# Patient Record
Sex: Female | Born: 1995 | Race: White | Hispanic: No | Marital: Single | Smoking: Never smoker
Health system: Southern US, Community
[De-identification: ages and names within clinical notes are randomized; demographics above are authoritative.]

## PROBLEM LIST (undated history)

## (undated) HISTORY — PX: ADENOIDECTOMY: SUR15

## (undated) HISTORY — PX: TONSILLECTOMY: SUR1361

---
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
## 2015-01-15 ENCOUNTER — Encounter: Payer: Self-pay | Admitting: Physician Assistant

## 2015-01-15 ENCOUNTER — Ambulatory Visit (INDEPENDENT_AMBULATORY_CARE_PROVIDER_SITE_OTHER): Payer: BLUE CROSS/BLUE SHIELD | Admitting: Physician Assistant

## 2015-01-15 ENCOUNTER — Ambulatory Visit: Payer: BLUE CROSS/BLUE SHIELD

## 2015-01-15 VITALS — BP 111/58 | HR 91 | Ht 68.01 in | Wt 146.0 lb

## 2015-01-15 DIAGNOSIS — F909 Attention-deficit hyperactivity disorder, unspecified type: Secondary | ICD-10-CM

## 2015-01-15 DIAGNOSIS — R41840 Attention and concentration deficit: Secondary | ICD-10-CM

## 2015-01-15 DIAGNOSIS — R208 Other disturbances of skin sensation: Secondary | ICD-10-CM | POA: Diagnosis not present

## 2015-01-15 DIAGNOSIS — Z832 Family history of diseases of the blood and blood-forming organs and certain disorders involving the immune mechanism: Secondary | ICD-10-CM

## 2015-01-15 DIAGNOSIS — I8391 Asymptomatic varicose veins of right lower extremity: Secondary | ICD-10-CM | POA: Diagnosis not present

## 2015-01-15 DIAGNOSIS — R200 Anesthesia of skin: Secondary | ICD-10-CM

## 2015-01-15 DIAGNOSIS — F988 Other specified behavioral and emotional disorders with onset usually occurring in childhood and adolescence: Secondary | ICD-10-CM

## 2015-01-15 LAB — COMPLETE METABOLIC PANEL WITH GFR
ALBUMIN: 3.9 g/dL (ref 3.6–5.1)
ALT: 12 U/L (ref 5–32)
AST: 15 U/L (ref 12–32)
Alkaline Phosphatase: 40 U/L — ABNORMAL LOW (ref 47–176)
BUN: 12 mg/dL (ref 7–20)
CHLORIDE: 103 mmol/L (ref 98–110)
CO2: 24 mmol/L (ref 20–31)
Calcium: 9 mg/dL (ref 8.9–10.4)
Creat: 0.72 mg/dL (ref 0.50–1.00)
GFR, Est African American: >89 mL/min (ref >=60)
GFR, Est Non African American: >89 mL/min (ref >=60)
GLUCOSE: 81 mg/dL (ref 65–99)
POTASSIUM: 4.1 mmol/L (ref 3.8–5.1)
SODIUM: 136 mmol/L (ref 135–146)
Total Bilirubin: 0.5 mg/dL (ref 0.2–1.1)
Total Protein: 6.7 g/dL (ref 6.3–8.2)

## 2015-01-15 LAB — CBC WITH DIFFERENTIAL/PLATELET
Basophils Absolute: 0 10*3/uL (ref 0.0–0.1)
Basophils Relative: 0 % (ref 0–1)
EOS ABS: 0.1 10*3/uL (ref 0.0–0.7)
Eosinophils Relative: 2 % (ref 0–5)
HCT: 38 % (ref 36.0–46.0)
HEMOGLOBIN: 12.7 g/dL (ref 12.0–15.0)
Lymphocytes Relative: 41 % (ref 12–46)
Lymphs Abs: 2.8 10*3/uL (ref 0.7–4.0)
MCH: 30.2 pg (ref 26.0–34.0)
MCHC: 33.4 g/dL (ref 30.0–36.0)
MCV: 90.5 fL (ref 78.0–100.0)
MONO ABS: 0.5 10*3/uL (ref 0.1–1.0)
MPV: 10 fL (ref 8.6–12.4)
Monocytes Relative: 7 % (ref 3–12)
NEUTROS PCT: 50 % (ref 43–77)
Neutro Abs: 3.5 10*3/uL (ref 1.7–7.7)
PLATELETS: 245 10*3/uL (ref 150–400)
RBC: 4.2 MIL/uL (ref 3.87–5.11)
RDW: 13.1 % (ref 11.5–15.5)
WBC: 6.9 10*3/uL (ref 4.0–10.5)

## 2015-01-15 MED ORDER — LISDEXAMFETAMINE DIMESYLATE 30 MG PO CAPS: 30.0000 mg | ORAL_CAPSULE | Freq: Every day | ORAL | Status: DC

## 2015-01-15 NOTE — Patient Instructions (Signed)
Could consider HPV and needs last dose of varicella.

## 2015-01-15 NOTE — Progress Notes (Deleted)
   Subjective:    Patient ID: Pamela Newton, female    DOB: April 30, 1995, 19 y.o.   MRN: [id number]  HPI    Review of Systems     Objective:   Physical Exam        Assessment & Plan:

## 2015-01-15 NOTE — Progress Notes (Addendum)
Pamela Newton is a 19 y.o. female who presents to [facility]  today for numbness on right inner thigh. Pamela Newton states that she began noticing tingling around the area of a previously known varicose vein on Saturday night, shortly it after it went numb and has remained numb. She originally went to an urgent care in [place] where she was told to go to the ED, which she did not. Patient's mother and sister are both positive for factor V leiden  Deficiency. She states that this has never happened before. She denies any trauma to the area, alleviating or aggravating factors, swelling, redness, warmth, loss of bowel or bladder function, fever, nausea, vomiting, diarrhea or constipation. Denies any lumbar back pain.   Patient has requested that we update her lab work and re-check her for factor V leiden deficiency because she was last checked in [place].   Patient requested refill of birth control today as well.   Pt need vyvanse refill. Denies any side effects. Doing well at school. Grades are wonderful.    No past medical history on file. Past Surgical History  Procedure Laterality Date  . Adenoidectomy    . Tonsillectomy     Social History  Substance Use Topics  . Smoking status: Never Smoker   . Smokeless tobacco: Never Used  . Alcohol Use: No   family history is not on file.  ROS as above Medications: Current Outpatient Prescriptions  Medication Sig Dispense Refill  . lisdexamfetamine (VYVANSE) 30 MG capsule Take 1 capsule (30 mg total) by mouth daily. 30 capsule 0  . Norethindrone Acetate-Ethinyl Estradiol (JUNEL,LOESTRIN,MICROGESTIN) 1.5-30 MG-MCG tablet Take 1 tablet by mouth daily. 3 Package 4   No current facility-administered medications for this visit.   Allergies  Allergen Reactions  . Concerta [Methylphenidate] Other (See Comments)    Mood swing and irritable  . Shellfish Allergy   . Amoxicillin Rash     Exam:  BP 111/58  mmHg  Pulse 91  Ht 5' 8.01" (1.727 m)  Wt 146 lb (66.225 kg)  BMI 22.20 kg/m2 Gen: WDWN female in no acute distress.  HEENT: EOMI,  MMM Lungs: Normal work of breathing. CTABL Heart: RRR no MRG Exts: Brisk capillary refill, warm and well perfused. Approximately 2 cm in length and 0.5 cm in width varicose vein on right upper thigh approximately mid-way between knee and hip. Patient describes area of numbness beginning at varicose vein and extending to approximately femoral head in length and approximately 3 cm in width. Patient states numbness does not extend to posterior, anterior or lateral thigh. Area is palpable and fully reducible w/o redness, warmth, pain or fluctuation.   Assessment: 1. Right lower extremity varicose vein and numbness based on patient h/o previus varicose veins, patient description of symptoms and physical exam findings.  2. ADD   Plan: 1. Venous doppler ordered to assess right thigh area for thrombosis or inflammation and to evaluate possible causes of numbness.  Addendum:  DVT was negative for DVT or SVT. There was some focal ectasia around area of concern.  Discussed light massage for patient and warm compresses. May need to concern vein specialist if varicose veins become a problem.   CMP, CBC and factor V leiden blood tests ordered today to assess factor V leiden status as well as fulfill patient request for up to date labwork.  Will hold OCP refill until u/s results are back and factor V leiden status is determined.  Patient instructed  to return to office or ED if symptoms worsen, patient experiences chest pain, syncope or shortness of breath. Patient conveyed understanding and agreed to current treatment plan.  2. Refilled for 90 days Vyvanse.

## 2015-01-18 LAB — FACTOR 5 LEIDEN

## 2015-01-19 ENCOUNTER — Other Ambulatory Visit: Payer: Self-pay | Admitting: Physician Assistant

## 2015-01-19 DIAGNOSIS — R200 Anesthesia of skin: Secondary | ICD-10-CM

## 2015-01-19 DIAGNOSIS — R202 Paresthesia of skin: Secondary | ICD-10-CM

## 2015-01-19 DIAGNOSIS — I8390 Asymptomatic varicose veins of unspecified lower extremity: Secondary | ICD-10-CM | POA: Insufficient documentation

## 2015-01-24 ENCOUNTER — Telehealth: Payer: Self-pay | Admitting: *Deleted

## 2015-01-24 NOTE — Telephone Encounter (Signed)
Does she have any other symptoms? Fever and cough?sinus pressure? Ear pain? ST?abdominal pain.   Is her leg warm? Red? Swollen? She certainly needs to see vein specialist veins can get infected and inflamed. When I saw her neither were the case. Can we make sure she gets appt ASAP. Will route to cindy as well. Referral in system.   If think fever is coming from leg [facility]

## 2015-01-24 NOTE — Telephone Encounter (Signed)
Pt's mom left vm stating that the pt has been running a fever and her leg has been swelling.  She stated that we didn't place the referral but it was done, she just hasn't been contacted yet.  Please advise on what pt should do.

## 2015-01-24 NOTE — Telephone Encounter (Signed)
LMOM advising pt to go to [facility] if she thinks the fever is coming from her leg.

## 2015-01-25 ENCOUNTER — Telehealth: Payer: Self-pay

## 2015-01-25 NOTE — Telephone Encounter (Signed)
Patient called and reports increase in swelling of her right leg. I was unable to get her a sooner appointment with [facility]. She is wanting to see someone ASAP. Please advise.

## 2015-01-26 NOTE — Telephone Encounter (Signed)
She can come here or go to urgent care.

## 2015-01-26 NOTE — Telephone Encounter (Signed)
Left message advising patient to call back to schedule a follow up.

## 2015-02-21 ENCOUNTER — Encounter: Payer: Self-pay | Admitting: *Deleted

## 2015-04-18 ENCOUNTER — Other Ambulatory Visit: Payer: Self-pay | Admitting: *Deleted

## 2015-04-18 DIAGNOSIS — Z3009 Encounter for other general counseling and advice on contraception: Secondary | ICD-10-CM

## 2015-04-18 MED ORDER — NORETHINDRONE ACET-ETHINYL EST 1.5-30 MG-MCG PO TABS: 1.0000 | ORAL_TABLET | Freq: Every day | ORAL | Status: DC

## 2015-10-15 ENCOUNTER — Ambulatory Visit (INDEPENDENT_AMBULATORY_CARE_PROVIDER_SITE_OTHER): Payer: BLUE CROSS/BLUE SHIELD | Admitting: Physician Assistant

## 2015-10-15 ENCOUNTER — Encounter: Payer: Self-pay | Admitting: Physician Assistant

## 2015-10-15 DIAGNOSIS — F988 Other specified behavioral and emotional disorders with onset usually occurring in childhood and adolescence: Secondary | ICD-10-CM

## 2015-10-15 DIAGNOSIS — F909 Attention-deficit hyperactivity disorder, unspecified type: Secondary | ICD-10-CM | POA: Diagnosis not present

## 2015-10-15 MED ORDER — LISDEXAMFETAMINE DIMESYLATE 30 MG PO CAPS: 30.0000 mg | ORAL_CAPSULE | Freq: Every day | ORAL | 0 refills | Status: DC

## 2015-10-15 NOTE — Progress Notes (Signed)
   Subjective:    Patient ID: Pamela Newton, female    DOB: 1995/05/26, 20 y.o.   MRN: [id number]  HPI Patient is a 20 year old female who presents to the clinic to restart her ADHD medication. She has been off this for a while. She does wish to restart it. 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 She feels like she will need this to help her study and focus. She does not take on weekends and during the summer. She denies any previous side effects.   Review of Systems  All other systems reviewed and are negative.      Objective:   Physical Exam  Constitutional: She is oriented to person, place, and time. She appears well-developed and well-nourished.  HENT:  Head: Normocephalic and atraumatic.  Cardiovascular: Normal rate, regular rhythm and normal heart sounds.   Pulmonary/Chest: Effort normal and breath sounds normal.  Neurological: She is alert and oriented to person, place, and time.  Psychiatric: She has a normal mood and affect. Her behavior is normal.          Assessment & Plan:  ADD- refilled vyvanse for 3 months. Vitals look great.

## 2016-01-13 ENCOUNTER — Other Ambulatory Visit: Payer: Self-pay | Admitting: Physician Assistant

## 2016-03-25 ENCOUNTER — Other Ambulatory Visit: Payer: Self-pay | Admitting: Physician Assistant

## 2016-03-25 ENCOUNTER — Other Ambulatory Visit: Payer: Self-pay | Admitting: *Deleted

## 2016-03-25 DIAGNOSIS — Z3009 Encounter for other general counseling and advice on contraception: Secondary | ICD-10-CM

## 2016-03-25 MED ORDER — NORETHINDRONE ACET-ETHINYL EST 1.5-30 MG-MCG PO TABS: 1.0000 | ORAL_TABLET | Freq: Every day | ORAL | 4 refills | Status: DC

## 2016-03-25 NOTE — Telephone Encounter (Signed)
Patient called to double check on birth control med refill.... Patient adv [facility] has sent a request and I adv will prob be refilled in the am on 03/26/16 but will send a message. Thanks

## 2016-03-25 NOTE — Telephone Encounter (Signed)
I signed rx today.

## 2016-12-19 ENCOUNTER — Encounter: Payer: Self-pay | Admitting: Physician Assistant

## 2016-12-19 ENCOUNTER — Ambulatory Visit (INDEPENDENT_AMBULATORY_CARE_PROVIDER_SITE_OTHER): Payer: Managed Care, Other (non HMO) | Admitting: Physician Assistant

## 2016-12-19 VITALS — BP 103/60 | HR 87 | Ht 68.0 in | Wt 135.4 lb

## 2016-12-19 DIAGNOSIS — R000 Tachycardia, unspecified: Secondary | ICD-10-CM | POA: Diagnosis not present

## 2016-12-19 DIAGNOSIS — F988 Other specified behavioral and emotional disorders with onset usually occurring in childhood and adolescence: Secondary | ICD-10-CM | POA: Diagnosis not present

## 2016-12-19 DIAGNOSIS — R41840 Attention and concentration deficit: Secondary | ICD-10-CM | POA: Diagnosis not present

## 2016-12-19 DIAGNOSIS — Z3009 Encounter for other general counseling and advice on contraception: Secondary | ICD-10-CM | POA: Diagnosis not present

## 2016-12-19 MED ORDER — AMPHETAMINE-DEXTROAMPHETAMINE 10 MG PO TABS: 10.0000 mg | ORAL_TABLET | Freq: Two times a day (BID) | ORAL | 0 refills | Status: DC

## 2016-12-19 NOTE — Progress Notes (Signed)
   Subjective:    Patient ID: Pamela Newton, female    DOB: Nov 01, 1995, 21 y.o.   MRN: [id number]  HPI  Pt is a 21 yo pleasant female who presents to the clinic for follow up on vyvanse for ADD. She feels like vyvanse works but way too expensive and cannot afford to take it every day. In the past she has tried Theatre manager but made her moody. She also has taken a few adderall XR and felt like she was moody as well. She would like to try immediate release today. She is in college.  Pt would like to consider other OCP options. She is interested in IUD but wants to know more.   Pt also mentions her heart rate at times will get alert from iwatch. When she exercises get up to 170-200. Before test it can get to around 130. She denies any palpitations or chest pains. She wants to know if this is normal. She denies her resting HR above 100.   .. Active Ambulatory Problems    Diagnosis Date Noted  . Dysmenorrhea 08/26/2011  . Outbursts of anger 05/28/2012  . Anxiety state, unspecified 05/28/2012  . Fatigue 04/15/2013  . Anhedonia 04/15/2013  . Inattention 02/08/2014  . Varicose veins 01/19/2015  . Birth control counseling 12/22/2016  . Tachycardia 12/22/2016  . 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 ABDOMINAL CRAMPS 02/13/2006   No Additional Past Medical History      Review of Systems  All other systems reviewed and are negative.      Objective:   Physical Exam  Constitutional: She is oriented to person, place, and time. She appears well-developed and well-nourished.  HENT:  Head: Normocephalic and atraumatic.  Neck: Normal range of motion. Neck supple. No thyromegaly present.  Cardiovascular: Normal rate, regular rhythm and normal heart sounds.   Pulmonary/Chest: Effort normal and breath sounds normal.  Neurological: She is alert and oriented to person, place, and time.  Psychiatric: She  has a normal mood and affect. Her behavior is normal.          Assessment & Plan:  Marland KitchenMarland KitchenSantasia was seen today for medication management.  Diagnoses and all orders for this visit:  Attention deficit disorder (ADD) without hyperactivity -     amphetamine-dextroamphetamine (ADDERALL) 10 MG tablet; Take 1 tablet (10 mg total) by mouth 2 (two) times daily.  Tachycardia -     TSH -     CBC with Differential/Platelet -     Ferritin -     COMPLETE METABOLIC PANEL WITH GFR  Inattention  Birth control counseling   Stop vyvanse will try immediate release adderall. Discussed side effects. Follow up in 1 month.   Tachycardia with exercise and with stress both seem like normal times for elevation. Discussed with pt the 220 minus your age rule with exercise. She is still within normal limits. Continue to monitor and keep a diary of when it happesn. Labs ordered to evaluate any metabolic cause.   Discussed OCP options. She would like to see GYN for IUD. Offered our services here but she declined and wanted to go to gyn.   Marland Kitchen.Spent 30 minutes with patient and greater than 50 percent of visit spent counseling patient regarding treatment plan.

## 2016-12-20 LAB — TSH: TSH: 1.88 m[IU]/L

## 2016-12-20 LAB — COMPLETE METABOLIC PANEL WITH GFR
AG RATIO: 1.6 (calc) (ref 1.0–2.5)
ALT: 18 U/L (ref 6–29)
AST: 20 U/L (ref 10–30)
Albumin: 4.4 g/dL (ref 3.6–5.1)
Alkaline phosphatase (APISO): 44 U/L (ref 33–115)
BILIRUBIN TOTAL: 0.6 mg/dL (ref 0.2–1.2)
BUN: 10 mg/dL (ref 7–25)
CALCIUM: 9.2 mg/dL (ref 8.6–10.2)
CHLORIDE: 105 mmol/L (ref 98–110)
CO2: 24 mmol/L (ref 20–32)
Creat: 0.81 mg/dL (ref 0.50–1.10)
GFR, EST AFRICAN AMERICAN: 121 mL/min/{1.73_m2} (ref >=60)
GFR, Est Non African American: 105 mL/min/{1.73_m2} (ref >=60)
GLOBULIN: 2.7 g/dL (ref 1.9–3.7)
Glucose, Bld: 67 mg/dL (ref 65–99)
POTASSIUM: 4.5 mmol/L (ref 3.5–5.3)
SODIUM: 139 mmol/L (ref 135–146)
Total Protein: 7.1 g/dL (ref 6.1–8.1)

## 2016-12-20 LAB — CBC WITH DIFFERENTIAL/PLATELET
Basophils Absolute: 22 cells/uL (ref 0–200)
Basophils Relative: 0.4 %
EOS PCT: 1.6 %
Eosinophils Absolute: 88 cells/uL (ref 15–500)
HEMATOCRIT: 38.1 % (ref 35.0–45.0)
Hemoglobin: 13.1 g/dL (ref 11.7–15.5)
Lymphs Abs: 3091 cells/uL (ref 850–3900)
MCH: 30.8 pg (ref 27.0–33.0)
MCHC: 34.4 g/dL (ref 32.0–36.0)
MCV: 89.6 fL (ref 80.0–100.0)
MONOS PCT: 7.2 %
MPV: 10.3 fL (ref 7.5–12.5)
NEUTROS PCT: 34.6 %
Neutro Abs: 1903 cells/uL (ref 1500–7800)
PLATELETS: 225 10*3/uL (ref 140–400)
RBC: 4.25 10*6/uL (ref 3.80–5.10)
RDW: 11.9 % (ref 11.0–15.0)
Total Lymphocyte: 56.2 %
WBC mixed population: 396 cells/uL (ref 200–950)
WBC: 5.5 10*3/uL (ref 3.8–10.8)

## 2016-12-20 LAB — FERRITIN: FERRITIN: 63 ng/mL (ref 10–154)

## 2016-12-22 ENCOUNTER — Encounter: Payer: Self-pay | Admitting: Physician Assistant

## 2016-12-22 DIAGNOSIS — R000 Tachycardia, unspecified: Secondary | ICD-10-CM | POA: Insufficient documentation

## 2016-12-22 DIAGNOSIS — F988 Other specified behavioral and emotional disorders with onset usually occurring in childhood and adolescence: Secondary | ICD-10-CM | POA: Insufficient documentation

## 2016-12-22 DIAGNOSIS — Z3009 Encounter for other general counseling and advice on contraception: Secondary | ICD-10-CM | POA: Insufficient documentation

## 2017-01-05 ENCOUNTER — Ambulatory Visit: Payer: Managed Care, Other (non HMO) | Admitting: Obstetrics and Gynecology

## 2017-01-19 ENCOUNTER — Ambulatory Visit: Payer: Managed Care, Other (non HMO) | Admitting: Physician Assistant

## 2017-01-26 ENCOUNTER — Ambulatory Visit (INDEPENDENT_AMBULATORY_CARE_PROVIDER_SITE_OTHER): Payer: Managed Care, Other (non HMO) | Admitting: Physician Assistant

## 2017-01-26 ENCOUNTER — Encounter: Payer: Self-pay | Admitting: Physician Assistant

## 2017-01-26 VITALS — BP 120/68 | HR 86 | Ht 68.0 in | Wt 134.0 lb

## 2017-01-26 DIAGNOSIS — F988 Other specified behavioral and emotional disorders with onset usually occurring in childhood and adolescence: Secondary | ICD-10-CM

## 2017-01-26 DIAGNOSIS — S93409A Sprain of unspecified ligament of unspecified ankle, initial encounter: Secondary | ICD-10-CM

## 2017-01-26 MED ORDER — AMPHETAMINE-DEXTROAMPHETAMINE 10 MG PO TABS: 10.0000 mg | ORAL_TABLET | Freq: Two times a day (BID) | ORAL | 0 refills | Status: DC

## 2017-01-26 NOTE — Patient Instructions (Signed)
Ankle Sprain, Phase I Rehab Ask your health care provider which exercises are safe for you. Do exercises exactly as told by your health care provider and adjust them as directed. It is normal to feel mild stretching, pulling, tightness, or discomfort as you do these exercises, but you should stop right away if you feel sudden pain or your pain gets worse.Do not begin these exercises until told by your health care provider. Stretching and range of motion exercises These exercises warm up your muscles and joints and improve the movement and flexibility of your lower leg and ankle. These exercises also help to relieve pain and stiffness. Exercise A: Gastroc and soleus stretch  1. Sit on the floor with your left / right leg extended. 2. Loop a belt or towel around the ball of your left / right foot. The ball of your foot is on the walking surface, right under your toes. 3. Keep your left / right ankle and foot relaxed and keep your knee straight while you use the belt or towel to pull your foot toward you. You should feel a gentle stretch behind your calf or knee. 4. Hold this position for __________ seconds, then release to the starting position. Repeat the exercise with your knee bent. You can put a pillow or a rolled bath towel under your knee to support it. You should feel a stretch deep in your calf or at your Achilles tendon. Repeat each stretch __________ times. Complete these stretches __________ times a day. Exercise B: Ankle alphabet  1. Sit with your left / right leg supported at the lower leg. ? Do not rest your foot on anything. ? Make sure your foot has room to move freely. 2. Think of your left / right foot as a paintbrush, and move your foot to trace each letter of the alphabet in the air. Keep your hip and knee still while you trace. Make the letters as large as you can without feeling discomfort. 3. Trace every letter from A to Z. Repeat __________ times. Complete this exercise  __________ times a day. Strengthening exercises These exercises build strength and endurance in your ankle and lower leg. Endurance is the ability to use your muscles for a long time, even after they get tired. Exercise C: Dorsiflexors  1. Secure a rubber exercise band or tube to an object, such as a table leg, that will stay still when the band is pulled. Secure the other end around your left / right foot. 2. Sit on the floor facing the object, with your left / right leg extended. The band or tube should be slightly tense when your foot is relaxed. 3. Slowly bring your foot toward you, pulling the band tighter. 4. Hold this position for __________ seconds. 5. Slowly return your foot to the starting position. Repeat __________ times. Complete this exercise __________ times a day. Exercise D: Plantar flexors  1. Sit on the floor with your left / right leg extended. 2. Loop a rubber exercise tube or band around the ball of your left / right foot. The ball of your foot is on the walking surface, right under your toes. ? Hold the ends of the band or tube in your hands. ? The band or tube should be slightly tense when your foot is relaxed. 3. Slowly point your foot and toes downward, pushing them away from you. 4. Hold this position for __________ seconds. 5. Slowly return your foot to the starting position. Repeat __________ times. Complete   this exercise __________ times a day. Exercise E: Evertors 1. Sit on the floor with your legs straight out in front of you. 2. Loop a rubber exercise band or tube around the ball of your left / right foot. The ball of your foot is on the walking surface, right under your toes. ? Hold the ends of the band in your hands, or secure the band to a stable object. ? The band or tube should be slightly tense when your foot is relaxed. 3. Slowly push your foot outward, away from your other leg. 4. Hold this position for __________ seconds. 5. Slowly return your  foot to the starting position. Repeat __________ times. Complete this exercise __________ times a day. This information is not intended to replace advice given to you by your health care provider. Make sure you discuss any questions you have with your health care provider. Document Released: 10/02/2004 Document Revised: 11/08/2015 Document Reviewed: 01/15/2015 Elsevier Interactive Patient Education  2018 Elsevier Inc.  

## 2017-01-26 NOTE — Progress Notes (Signed)
   Subjective:    Patient ID: Pamela Newton, female    DOB: 03/10/1996, 21 y.o.   MRN: [id number]  HPI  Patient is a 21 year old female who presents to the clinic to follow-up on switching to immediate release Adderall from Vyvanse.  She loves this much better.  She feels like she can get up and going better.  She also feels like she is not as stimulated all day long.  She is resting well at night.  We were tracking her episodic heart rate that was of concern to patient.  She has now kept a diary and she notices that it is right before she takes a test or worrying about something.  She denies any palpitations or chest pain.  She feels like her overall mood is much better.   Yesterday patient rolled her left ankle.  She would like for me to take a look at it.  She has put it in her arm brace.  She denies any significant pain.  She has had some swelling.  She has not been taking any anti-inflammatories or icing.  .. Active Ambulatory Problems    Diagnosis Date Noted  . Dysmenorrhea 08/26/2011  . Outbursts of anger 05/28/2012  . Anxiety state, unspecified 05/28/2012  . Fatigue 04/15/2013  . Anhedonia 04/15/2013  . Inattention 02/08/2014  . Varicose veins 01/19/2015  . Birth control counseling 12/22/2016  . Tachycardia 12/22/2016  . 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 Sprain of ligament of ankle 01/26/2017   Resolved Ambulatory Problems    Diagnosis Date Noted  . ABDOMINAL CRAMPS 02/13/2006   No Additional Past Medical History      Review of Systems  All other systems reviewed and are negative.      Objective:   Physical Exam  Constitutional: She is oriented to person, place, and time. She appears well-developed and well-nourished.  HENT:  Head: Normocephalic and atraumatic.  Cardiovascular: Normal rate, regular rhythm and normal heart sounds.  Pulmonary/Chest: Effort normal and breath sounds normal. She has no wheezes.  Musculoskeletal:   Normal ROM of left ankle. No swelling. Bruising around lateral posterior malleous. Tenderness behind lateral posterior malleous. Strength 5/5.   Neurological: She is alert and oriented to person, place, and time.  Psychiatric: She has a normal mood and affect. Her behavior is normal.          Assessment & Plan:  Marland Kitchen.Marland Kitchen.Pamela Newton was seen today for adhd and left ankle pain.  Diagnoses and all orders for this visit:  Attention deficit disorder (ADD) without hyperactivity -     Discontinue: amphetamine-dextroamphetamine (ADDERALL) 10 MG tablet; Take 1 tablet (10 mg total) 2 (two) times daily by mouth. -     Discontinue: amphetamine-dextroamphetamine (ADDERALL) 10 MG tablet; Take 1 tablet (10 mg total) 2 (two) times daily by mouth. Do not refill until 02/24/17. -     amphetamine-dextroamphetamine (ADDERALL) 10 MG tablet; Take 1 tablet (10 mg total) 2 (two) times daily by mouth. Do not refill until 03/27/17.  Sprain of ligament of ankle, unspecified laterality, initial encounter   Refilled for 3 months.   Discuss sprain. Encouraged ice, ibuprofen, compression, rest. HO given to start PT in next 5 days. Wear brace for next 4 weeks as much as she can tolerate.

## 2017-04-10 ENCOUNTER — Other Ambulatory Visit: Payer: Self-pay | Admitting: Physician Assistant

## 2017-04-13 NOTE — Telephone Encounter (Signed)
Patient called 04/10/17/ to request refill on OCPs; noted that she has not been seen for PE and discussion of birth control in over a year; phoned in one refill and left message for patient to [facility]

## 2017-05-07 ENCOUNTER — Ambulatory Visit (INDEPENDENT_AMBULATORY_CARE_PROVIDER_SITE_OTHER): Payer: Managed Care, Other (non HMO) | Admitting: Obstetrics and Gynecology

## 2017-05-07 ENCOUNTER — Encounter: Payer: Self-pay | Admitting: Obstetrics and Gynecology

## 2017-05-07 VITALS — BP 113/74 | HR 114 | Ht 68.0 in | Wt 138.0 lb

## 2017-05-07 DIAGNOSIS — Z30430 Encounter for insertion of intrauterine contraceptive device: Secondary | ICD-10-CM

## 2017-05-07 DIAGNOSIS — Z01419 Encounter for gynecological examination (general) (routine) without abnormal findings: Secondary | ICD-10-CM

## 2017-05-07 DIAGNOSIS — Z3202 Encounter for pregnancy test, result negative: Secondary | ICD-10-CM

## 2017-05-07 DIAGNOSIS — Z01812 Encounter for preprocedural laboratory examination: Secondary | ICD-10-CM

## 2017-05-07 LAB — POCT URINE PREGNANCY: Preg Test, Ur: NEGATIVE

## 2017-05-07 MED ORDER — LEVONORGESTREL 20 MCG/24HR IU IUD
INTRAUTERINE_SYSTEM | Freq: Once | INTRAUTERINE | Status: AC
  Administered 2017-05-07: 15:00:00 via INTRAUTERINE

## 2017-05-07 NOTE — Progress Notes (Signed)
PT wants to discuss IUD

## 2017-05-07 NOTE — Addendum Note (Signed)
Addended by: Marya LandryFOSTER, Jeramiah Mccaughey D on: 05/07/2017 03:00 PM   Modules accepted: Orders

## 2017-05-07 NOTE — Progress Notes (Signed)
Subjective:     Pamela Newton is a 22 y.o. female G0 with BMI 20 who is here for a comprehensive physical exam. The patient reports no problems. She is sexually active using OCP for contraception. She is interested in Mirena IUD but has concerns that it may cause her to have acne as it did for her sister. Patient is otherwise without complaints. She is sexually active using OCP and condoms for contraception. 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 No pertinent past medical history. Past Surgical History:  Procedure Laterality Date  . ADENOIDECTOMY    . TONSILLECTOMY     History reviewed. No pertinent family history.   Social History   Socioeconomic History  . Marital status: Single    Spouse name: Not on file  . Number of children: Not on file  . Years of education: Not on file  . Highest education level: Not on file  Social Needs  . Financial resource strain: Not on file  . Food insecurity - worry: Not on file  . Food insecurity - inability: Not on file  . Transportation needs - medical: Not on file  . Transportation needs - non-medical: Not on file  Occupational History  . Not on file  Tobacco Use  . Smoking status: Never Smoker  . Smokeless tobacco: Never Used  Substance and Sexual Activity  . Alcohol use: No  . Drug use: No  . Sexual activity: Yes    Birth control/protection: None  Other Topics Concern  . Not on file  Social History Narrative  . Not on file   Health Maintenance  Topic Date Due  . HIV Screening  02/13/2011  . TETANUS/TDAP  02/01/2017  . PAP SMEAR  02/12/2017  . INFLUENZA VACCINE  10/16/2017 (Originally 10/15/2016)       Review of Systems Pertinent items are noted in HPI.   Objective:  Blood pressure 113/74, pulse (!) 114, height 5\' 8"  (1.727 m), weight 138 lb (62.6 kg), last menstrual period 05/06/2017.     GENERAL: Well-developed, well-nourished female in no acute distress.  HEENT: Normocephalic,  atraumatic. Sclerae anicteric.  NECK: Supple. Normal thyroid.  LUNGS: Clear to auscultation bilaterally.  HEART: Regular rate and rhythm. BREASTS: Symmetric in size. No palpable masses or lymphadenopathy, skin changes, or nipple drainage. ABDOMEN: Soft, nontender, nondistended. No organomegaly. PELVIC: Normal external female genitalia. Vagina is pink and rugated.  Normal discharge. Normal appearing cervix. Uterus is normal in size. No adnexal mass or tenderness. EXTREMITIES: No cyanosis, clubbing, or edema, 2+ distal pulses.    Assessment:    Healthy female exam.      Plan:    Pap smear with cultures collected Patient desires IUD insertion today IUD Procedure Note Patient identified, informed consent performed, signed copy in chart, time out was performed.  Urine pregnancy test negative.  Speculum placed in the vagina.  Cervix visualized.  Cleaned with Betadine x 2.  Grasped anteriorly with a single tooth tenaculum.  Uterus sounded to 7 cm.  Mirena IUD placed per manufacturer's recommendations.  Strings trimmed to 3 cm. Tenaculum was removed, good hemostasis noted.  Patient tolerated procedure well.   Patient given post procedure instructions and Mirena care card with expiration date.  Patient is asked to check IUD strings periodically and follow up in 4-6 weeks for IUD check.  Patient will be contacted with abnormal results See After Visit Summary for Counseling Recommendations

## 2017-05-08 LAB — CYTOLOGY - PAP
Chlamydia: NEGATIVE
DIAGNOSIS: NEGATIVE
Neisseria Gonorrhea: NEGATIVE
TRICH (WINDOWPATH): NEGATIVE

## 2017-05-11 ENCOUNTER — Telehealth: Payer: Self-pay

## 2017-05-11 NOTE — Telephone Encounter (Signed)
Left message on pt's phone asking her to call office for pap smear results

## 2017-05-11 NOTE — Telephone Encounter (Signed)
Spoke with pt and she is aware of normal pap smear results 

## 2017-05-27 IMAGING — US US EXTREM LOW VENOUS*R*
1 series · 13 of 24 positions shown · non-contrast
Comparison: None.

CLINICAL DATA: 18-year-old female with family history of blood
clotting disorder and painful bulge in the medial aspect of the mid
thigh for the past 2 days



[Series 1: us extrem low venous*right* · 0.06mm/px · 13 of 31 slices shown]
[im 1/31]
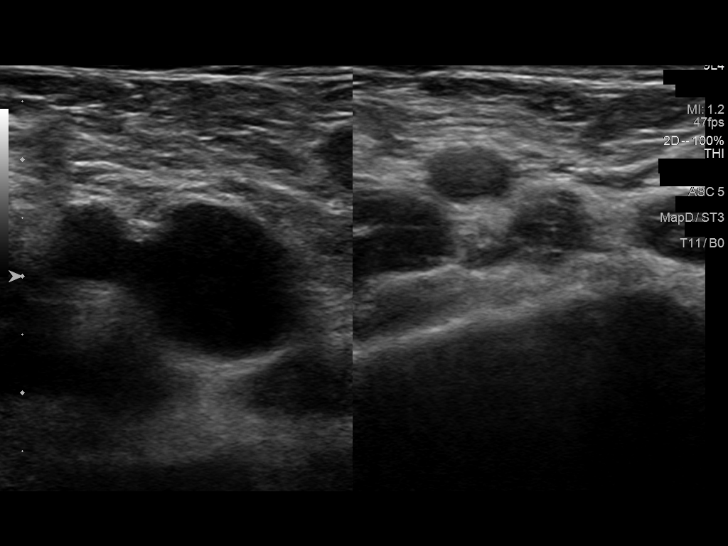
[im 3/31]
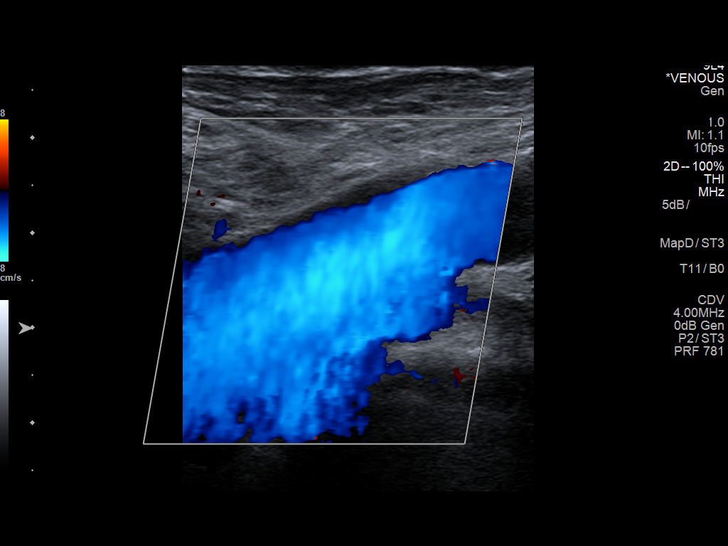
[im 6/31]
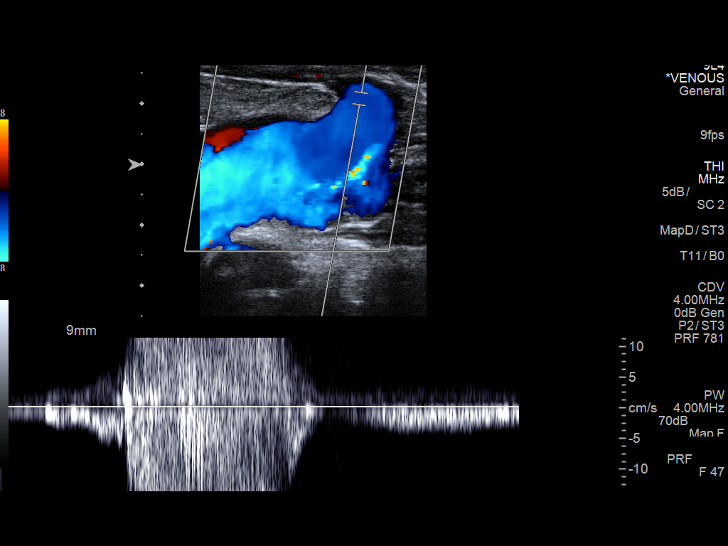
[im 8/31]
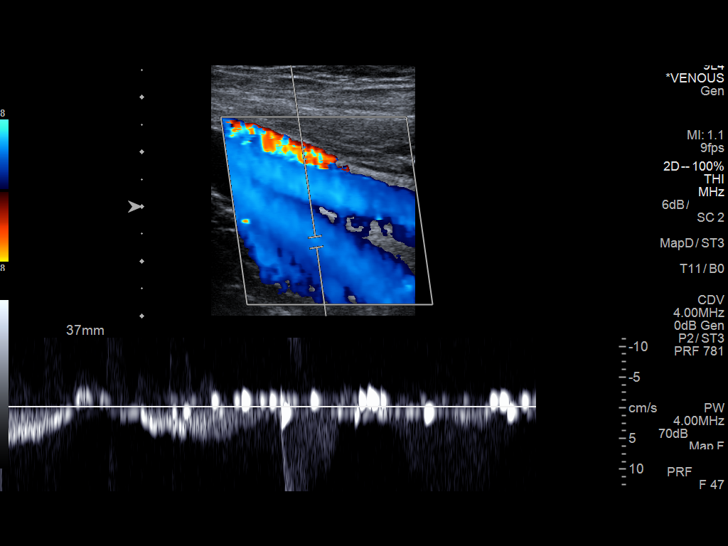
[im 11/31]
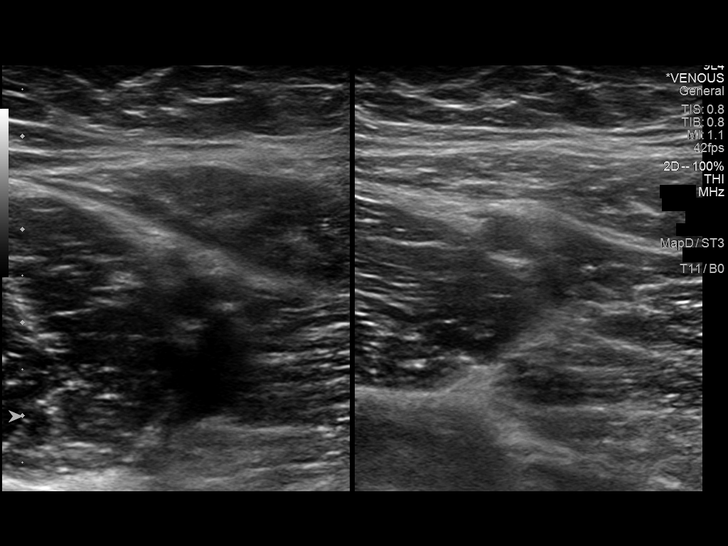
[im 14/31]
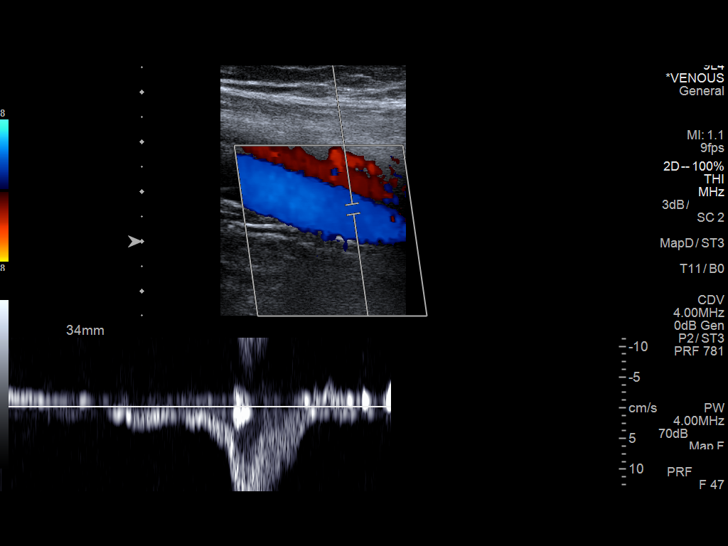
[im 16/31]
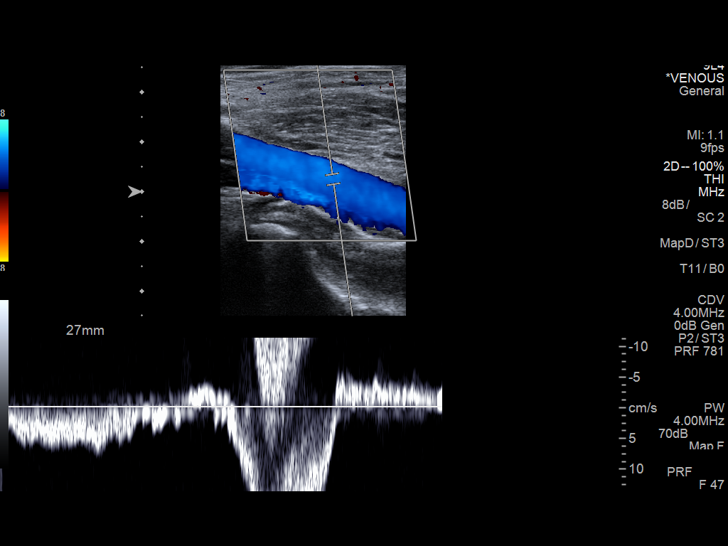
[im 17/31]
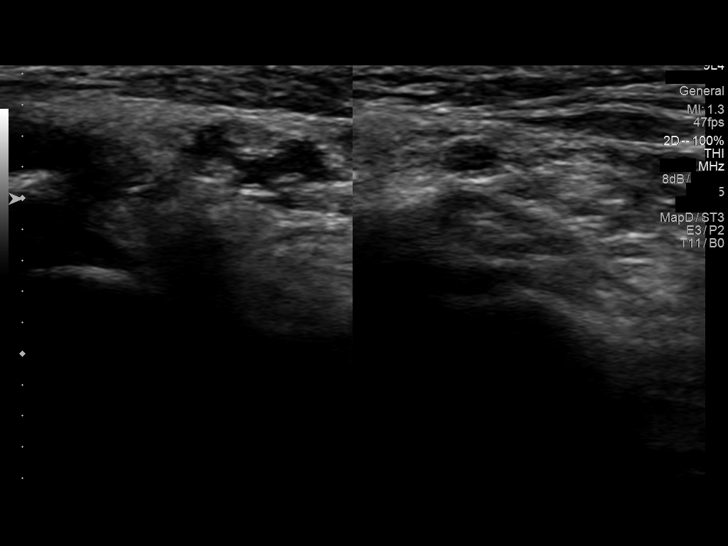
[im 20/31]
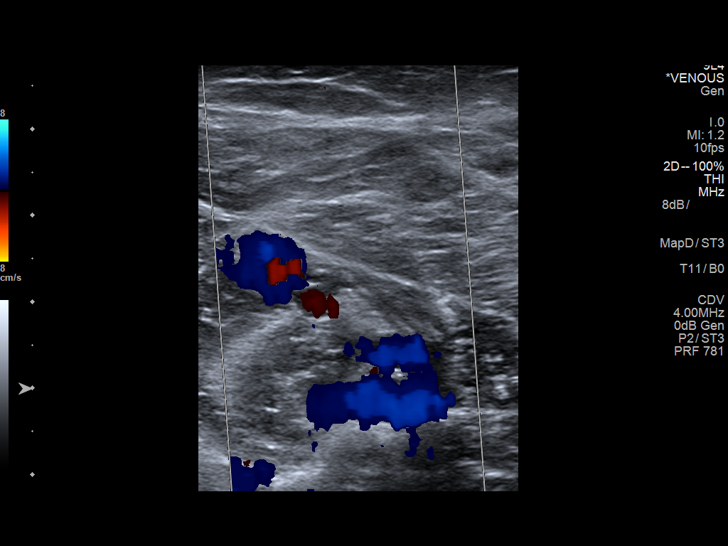
[im 23/31]
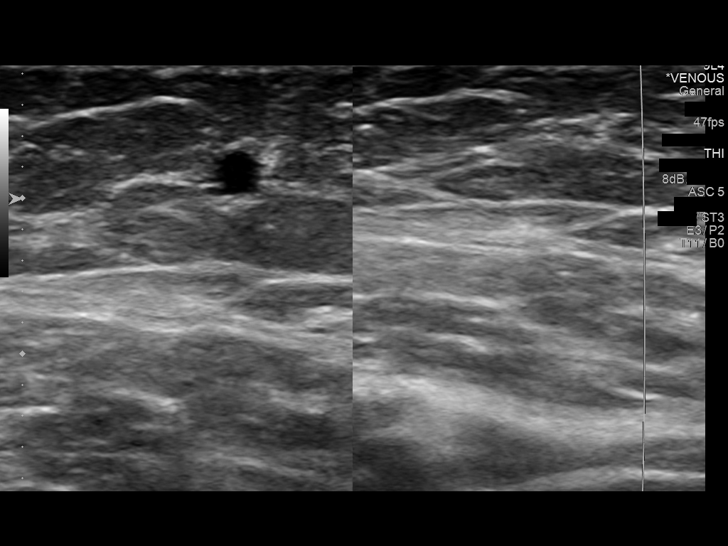
[im 25/31]
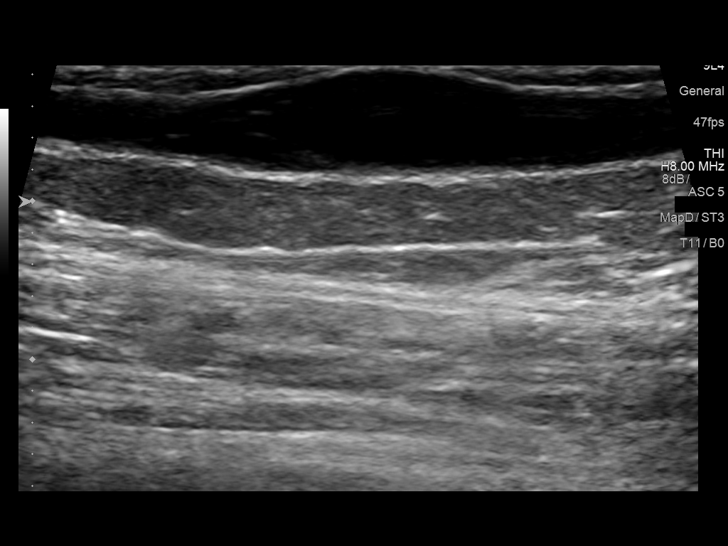
[im 28/31]
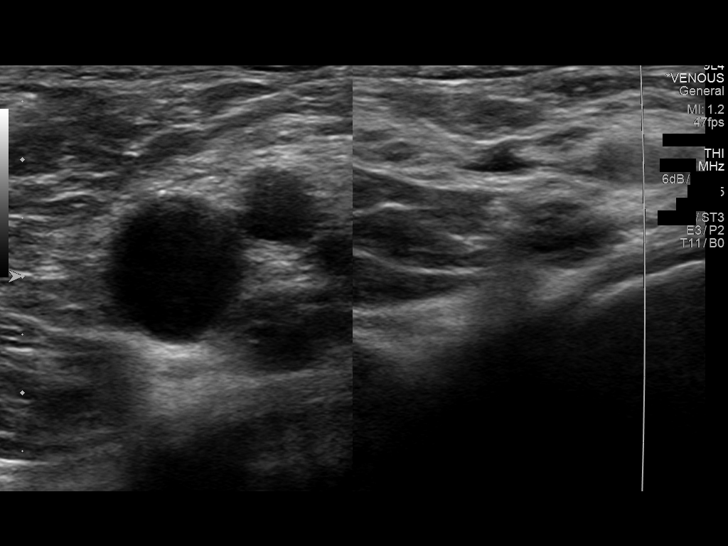
[im 31/31]
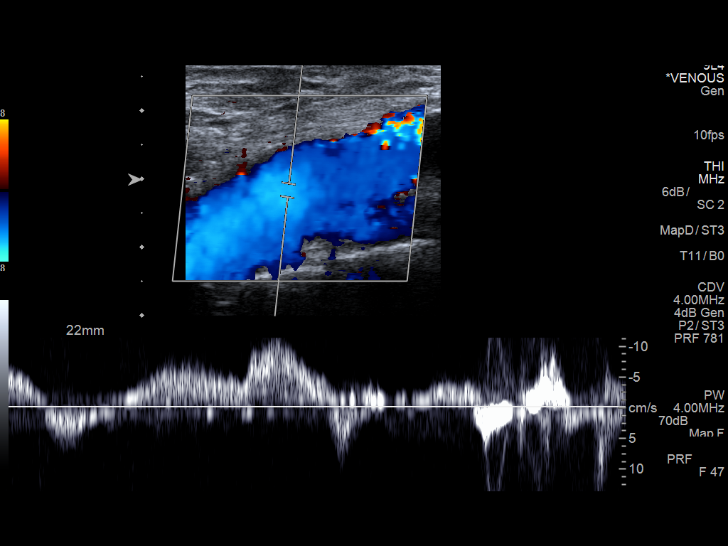

[13 of 24 positions shown; findings below may reference images not displayed]

FINDINGS: Contralateral Common Femoral Vein: Respiratory phasicity is normal
and symmetric with the symptomatic side. No evidence of thrombus.
Normal compressibility.

Common Femoral Vein: No evidence of thrombus. Normal
compressibility, respiratory phasicity and response to augmentation.

Saphenofemoral Junction: No evidence of thrombus. Normal
compressibility and flow on color Doppler imaging.

Profunda Femoral Vein: No evidence of thrombus. Normal
compressibility and flow on color Doppler imaging.

Femoral Vein: No evidence of thrombus. Normal compressibility,
respiratory phasicity and response to augmentation.

Popliteal Vein: No evidence of thrombus. Normal compressibility,
respiratory phasicity and response to augmentation.

Calf Veins: No evidence of thrombus. Normal compressibility and flow
on color Doppler imaging.

Superficial Great Saphenous Vein: No evidence of thrombus. Normal
compressibility and flow on color Doppler imaging. Focal ectasia of
the great saphenous vein in the midthigh to a diameter of
approximately 7 mm noted in the region of clinical concern.

Venous Reflux:  None.

Other Findings:  None.
IMPRESSION: 1. No evidence of deep venous thrombosis.
2. The region of clinical concern corresponds with a focally ectatic
7 mm superficial great saphenous vein. No evidence of superficial
thrombosis or thrombophlebitis.

## 2017-06-05 ENCOUNTER — Ambulatory Visit (INDEPENDENT_AMBULATORY_CARE_PROVIDER_SITE_OTHER): Payer: Managed Care, Other (non HMO) | Admitting: Advanced Practice Midwife

## 2017-06-05 ENCOUNTER — Encounter: Payer: Self-pay | Admitting: Advanced Practice Midwife

## 2017-06-05 ENCOUNTER — Encounter: Payer: Self-pay | Admitting: Physician Assistant

## 2017-06-05 ENCOUNTER — Ambulatory Visit (INDEPENDENT_AMBULATORY_CARE_PROVIDER_SITE_OTHER): Payer: Managed Care, Other (non HMO) | Admitting: Physician Assistant

## 2017-06-05 VITALS — BP 104/72 | HR 80 | Resp 16 | Ht 68.0 in | Wt 137.0 lb

## 2017-06-05 VITALS — BP 117/73 | HR 77 | Ht 68.0 in | Wt 135.0 lb

## 2017-06-05 DIAGNOSIS — F988 Other specified behavioral and emotional disorders with onset usually occurring in childhood and adolescence: Secondary | ICD-10-CM

## 2017-06-05 DIAGNOSIS — Z30431 Encounter for routine checking of intrauterine contraceptive device: Secondary | ICD-10-CM | POA: Diagnosis not present

## 2017-06-05 MED ORDER — AMPHETAMINE-DEXTROAMPHETAMINE 10 MG PO TABS: 10.0000 mg | ORAL_TABLET | Freq: Two times a day (BID) | ORAL | 0 refills | Status: AC

## 2017-06-05 NOTE — Progress Notes (Signed)
   Subjective:    Patient ID: Pamela Newton, female    DOB: [birth year], 22 y.o.   MRN: [id number]  HPI Pt is a 22 yo female who presents to the clinic for ADHD. She is graduating college this may. She has a job lined up for July. She is doing well in school. She denies any insomnia, palpitations, headaches, anxiety.   .. Active Ambulatory Problems    Diagnosis Date Noted  . Dysmenorrhea 08/26/2011  . Outbursts of anger 05/28/2012  . Anxiety state, unspecified 05/28/2012  . Fatigue 04/15/2013  . Anhedonia 04/15/2013  . Inattention 02/08/2014  . Varicose veins 01/19/2015  . Birth control counseling 12/22/2016  . Tachycardia 12/22/2016  . 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 Sprain of ligament of ankle 01/26/2017   Resolved Ambulatory Problems    Diagnosis Date Noted  . ABDOMINAL CRAMPS 02/13/2006   No Additional Past Medical History      Review of Systems  All other systems reviewed and are negative.      Objective:   Physical Exam  Constitutional: She is oriented to person, place, and time. She appears well-developed and well-nourished.  HENT:  Head: Normocephalic and atraumatic.  Neck: No thyromegaly present.  Cardiovascular: Normal rate, regular rhythm and normal heart sounds.  Neurological: She is alert and oriented to person, place, and time.  Psychiatric: She has a normal mood and affect. Her behavior is normal.          Assessment & Plan:  Marland Kitchen.Marland Kitchen.Pamela Newton was seen today for adhd.  Diagnoses and all orders for this visit:  Attention deficit disorder (ADD) without hyperactivity -     amphetamine-dextroamphetamine (ADDERALL) 10 MG tablet; Take 1 tablet (10 mg total) by mouth 2 (two) times daily. -     amphetamine-dextroamphetamine (ADDERALL) 10 MG tablet; Take 1 tablet (10 mg total) by mouth 2 (two) times daily. -     amphetamine-dextroamphetamine (ADDERALL) 10 MG tablet; Take 1 tablet (10 mg total) by mouth 2 (two) times daily  with a meal.   Follow up in 3 months.

## 2017-06-05 NOTE — Patient Instructions (Signed)
Preventive Care for [place], Female The transition to life after high school as a young adult can be a stressful time with many changes. This is the time when your health care becomes your responsibility. Preventive care refers to lifestyle choices and visits with your health care provider that can promote health and wellness. What does preventive care include?  A yearly physical exam. This is also called an annual wellness visit.  Dental exams once or twice a year.  Routine eye exams. Ask your health care provider how often you should have your eyes checked.  Personal lifestyle choices, including: ? Daily care of your teeth and gums. ? Regular physical activity. ? Eating a healthy diet. ? Avoiding tobacco and drug use. ? Avoiding or limiting alcohol use. ? Practicing safe sex. ? Taking vitamin and mineral supplements as recommended by your health care provider. What happens during an annual wellness visit? Preventive care starts with a yearly visit to your primary care physician. The services and screenings done by your health care provider during your annual wellness visit will depend on your overall health, lifestyle risk factors, and family history of disease. Counseling Your health care provider may ask you questions about:  Past medical problems and your family's medical history.  Medicines or supplements you take.  Health insurance and access to health care.  Alcohol, tobacco, and drug use.  Your safety at home, work, or school.  Access to firearms.  Emotional well-being and how you cope with stress.  Relationship well-being.  Diet, exercise, and sleep habits.  Your sexual health and activity.  Your methods of birth control.  Your menstrual cycle.  Your pregnancy history.  Screening You may have the following tests or measurements:  Height, weight, and BMI.  Blood pressure.  Lipid and cholesterol levels.  Tuberculosis skin test.  Skin  exam.  Vision and hearing tests.  Screening test for hepatitis.  Screening tests for sexually transmitted diseases (STDs), if you are at risk.  BRCA-related cancer screening. This may be done if you have a family history of breast, ovarian, tubal, or peritoneal cancers.  Pelvic exam and Pap test. This may be done every 3 years starting at age 22.  Vaccines Your health care provider may recommend certain vaccines, such as:  Influenza vaccine. This is recommended every year.  Tetanus, diphtheria, and acellular pertussis (Tdap, Td) vaccine. You may need a Td booster every 10 years.  Varicella vaccine. You may need this if you have not been vaccinated.  HPV vaccine. If you are 22 or younger, you may need three doses over 6 months.  Measles, mumps, and rubella (MMR) vaccine. You may need at least one dose of MMR. You may also need a second dose.  Pneumococcal 13-valent conjugate (PCV13) vaccine. You may need this if you have certain conditions and were not previously vaccinated.  Pneumococcal polysaccharide (PPSV23) vaccine. You may need one or two doses if you smoke cigarettes or if you have certain conditions.  Meningococcal vaccine. One dose is recommended if you are age 3-21 years and a first-year college student living in a residence hall, or if you have one of several medical conditions. You may also need additional booster doses.  Hepatitis A vaccine. You may need this if you have certain conditions or if you travel or work in places where you may be exposed to hepatitis A.  Hepatitis B vaccine. You may need this if you have certain conditions or if you travel or work in places  where you may be exposed to hepatitis B.  Haemophilus influenzae type b (Hib) vaccine. You may need this if you have certain risk factors.  Talk to your health care provider about which screenings and vaccines you need and how often you need them. What steps can I take to develop healthy  behaviors?  Have regular preventive health care visits with your primary care physician and dentist.  Eat a healthy diet.  Drink enough fluid to keep your urine clear or pale yellow.  Stay active. Exercise at least 30 minutes 5 or more days of the week.  Use alcohol responsibly.  Maintain a healthy weight.  Do not use any products that contain nicotine, such as cigarettes, chewing tobacco, and e-cigarettes. If you need help quitting, ask your health care provider.  Do not use drugs.  Practice safe sex.  Use birth control (contraception) to prevent unwanted pregnancy. If you plan to become pregnant, see your health care provider for a pre-conception visit.  Find healthy ways to manage stress. How can I protect myself from injury? Injuries from violence or accidents are the leading cause of death among young adults and can often be prevented. Take these steps to help protect yourself:  Always wear your seat belt while driving or riding in a vehicle.  Do not drive if you have been drinking alcohol. Do not ride with someone who has been drinking.  Do not drive when you are tired or distracted. Do not text while driving.  Wear a helmet and other protective equipment during sports activities.  If you have firearms in your house, make sure you follow all gun safety procedures.  Seek help if you have been bullied, physically abused, or sexually abused.  Use the Internet responsibly to avoid dangers such as online bullying and online sexual predators.  What can I do to cope with stress? Young adults may face many new challenges that can be stressful, such as finding a job, going to college, moving away from home, managing money, being in a relationship, getting married, and having children. To manage stress:  Avoid known stressful situations when you can.  Exercise regularly.  Find a stress-reducing activity that works best for you. Examples include meditation, yoga, listening  to music, or reading.  Spend time in nature.  Keep a journal to write about your stress and how you respond.  Talk to your health care provider about stress. He or she may suggest counseling.  Spend time with supportive friends or family.  Do not cope with stress by: ? Drinking alcohol or using drugs. ? Smoking cigarettes. ? Eating.  Where can I get more information? Learn more about preventive care and healthy habits from:  [place] and Gynecologists: KaraokeExchange.nl  U.S. Probation officer Task Force: StageSync.si  National Adolescent and [place]: StrategicRoad.nl  American Academy of Pediatrics Bright Futures: https://brightfutures.MemberVerification.co.za  Society for Adolescent Health and Medicine: MoralBlog.co.za.aspx  PodExchange.nl: ToyLending.fr  This information is not intended to replace advice given to you by your health care provider. Make sure you discuss any questions you have with your health care provider. Document Released: 07/19/2015 Document Revised: 08/09/2015 Document Reviewed: 07/19/2015 Elsevier Interactive Patient Education  [employer].

## 2017-06-05 NOTE — Progress Notes (Signed)
Subjective:     Patient ID: Pamela Newton, female   DOB: 12-Jul-1995, 22 y.o.   MRN: [id number]  HPI: Here for string check. IUD placed 05/07/17. Was surprised at how painful insertion was, but has not had pain since then. Spotting most days since insertion--light brown now.   Review of Systems  Constitutional: Negative for fever.  Gastrointestinal: Negative for abdominal pain.  Genitourinary: Positive for vaginal bleeding. Negative for vaginal discharge.       Objective: BP 104/72   Pulse 80   Resp 16   Ht 5\' 8"  (1.727 m)   Wt 137 lb (62.1 kg)   LMP 05/06/2017   BMI 20.83 kg/m    Physical Exam  Constitutional: She is oriented to person, place, and time. She appears well-developed and well-nourished. No distress.  Cardiovascular: Normal rate.  Pulmonary/Chest: Effort normal. No respiratory distress.  Abdominal: Soft. There is no tenderness.  Genitourinary: There is no lesion on the right labia. There is no lesion on the left labia. Cervix exhibits no discharge. There is bleeding (light brown blood) in the vagina. No tenderness in the vagina. No vaginal discharge found.    Genitourinary Comments: Strings seen  Musculoskeletal: She exhibits no edema.  Neurological: She is alert and oriented to person, place, and time.  Skin: Skin is warm and dry.  Psychiatric: She has a normal mood and affect.  Nursing note and vitals reviewed.      Assessment:    String check Spotting w/ IUD    Plan:     Offered OCP pack if bleeding becomes problematic. Declined for now. Reminded that IUDs do not prevent STI's. Recommend condoms. F/U in 1 year.  Katrinka BlazingSmith, [place], CNM 06/05/2017 11:25 AM

## 2017-07-01 ENCOUNTER — Telehealth: Payer: Self-pay | Admitting: *Deleted

## 2017-07-01 NOTE — Telephone Encounter (Signed)
Pt left vm stating that is going to be traveling to [place] and wants to know what vaccines she may need.

## 2017-07-01 NOTE — Telephone Encounter (Signed)
Recommendation by the CDC is hepatitis A, hepatitis B, typhoid, Cholera, yellow fever, Japanese encephalitis, rabies, polio, measles mumps rubella, Tdap, varicella., shingles, pneumonia, meningitis.  Problem he has had all be childhood vaccines.  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  You can go to occupational health downstairs or health department.  The rabies vaccine unless you are going to be working with animals in [place] I do not suggest to get this.  Amber, check and make sure Tdap is up to date.

## 2017-07-02 NOTE — Telephone Encounter (Signed)
LMOM for pt to return call. 

## 2018-02-15 ENCOUNTER — Ambulatory Visit: Payer: Managed Care, Other (non HMO) | Admitting: Physician Assistant
